# Patient Record
Sex: Male | Born: 1995 | Race: Black or African American | Hispanic: No | Marital: Single | Smoking: Current some day smoker
Health system: Southern US, Community
[De-identification: ages and names within clinical notes are randomized; demographics above are authoritative.]

## PROBLEM LIST (undated history)

## (undated) DIAGNOSIS — F419 Anxiety disorder, unspecified: Secondary | ICD-10-CM

## (undated) HISTORY — DX: Anxiety disorder, unspecified: F41.9

---
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
## 2014-10-05 IMAGING — CR DG KNEE COMPLETE 4+V*R*
4 series · 4 of 4 positions shown · non-contrast
Comparison: None.

CLINICAL DATA: Trauma and pain.

RIGHT KNEE - COMPLETE 4+ VIEW

[view not recorded (1 of 4)]
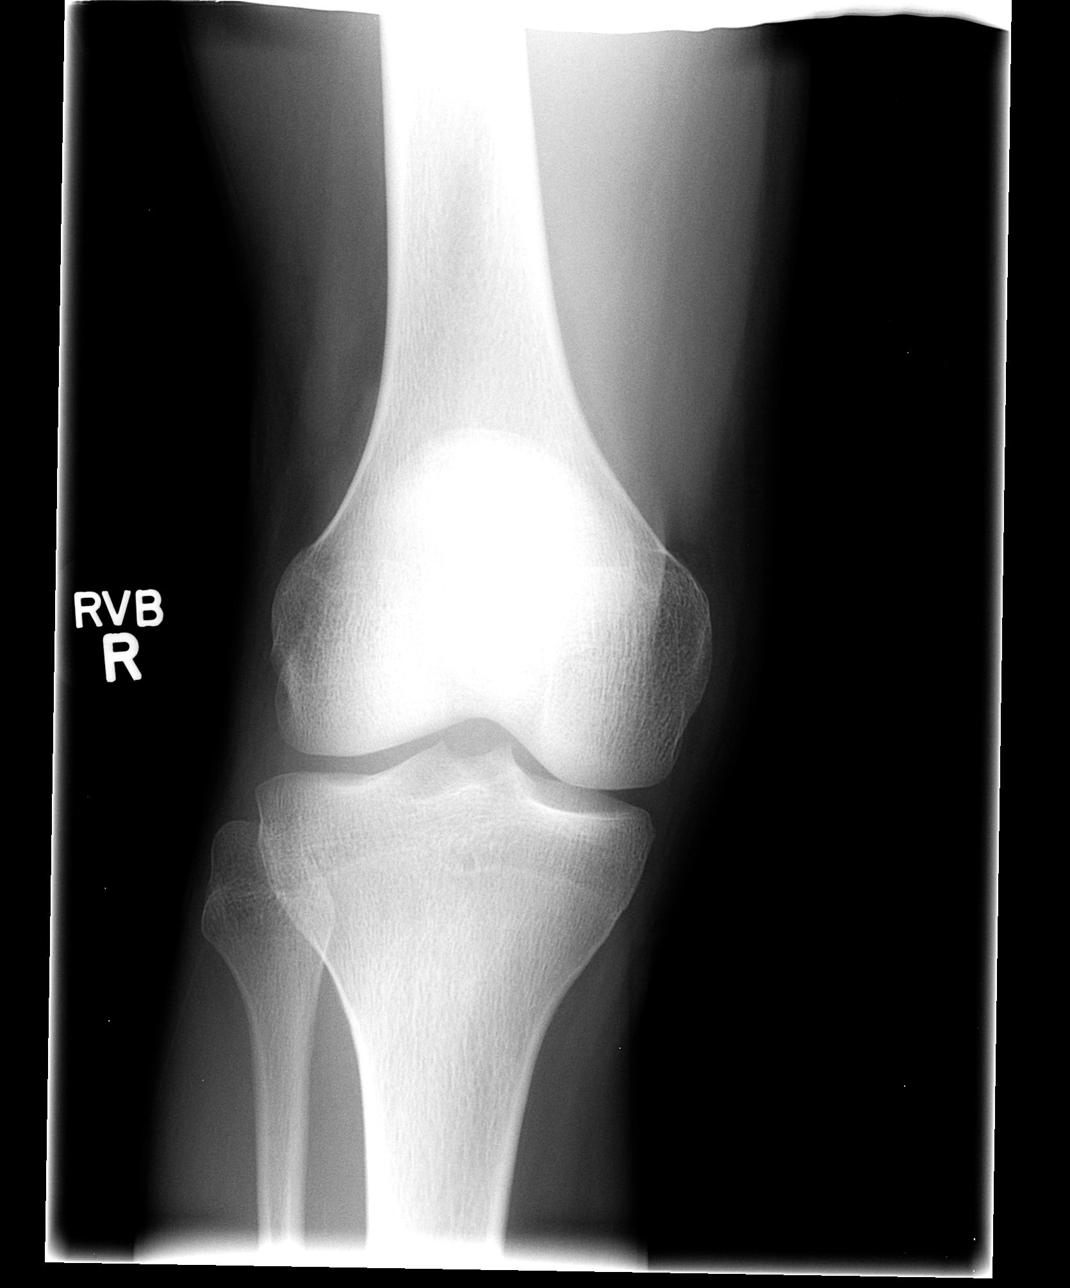

[view not recorded (2 of 4)]
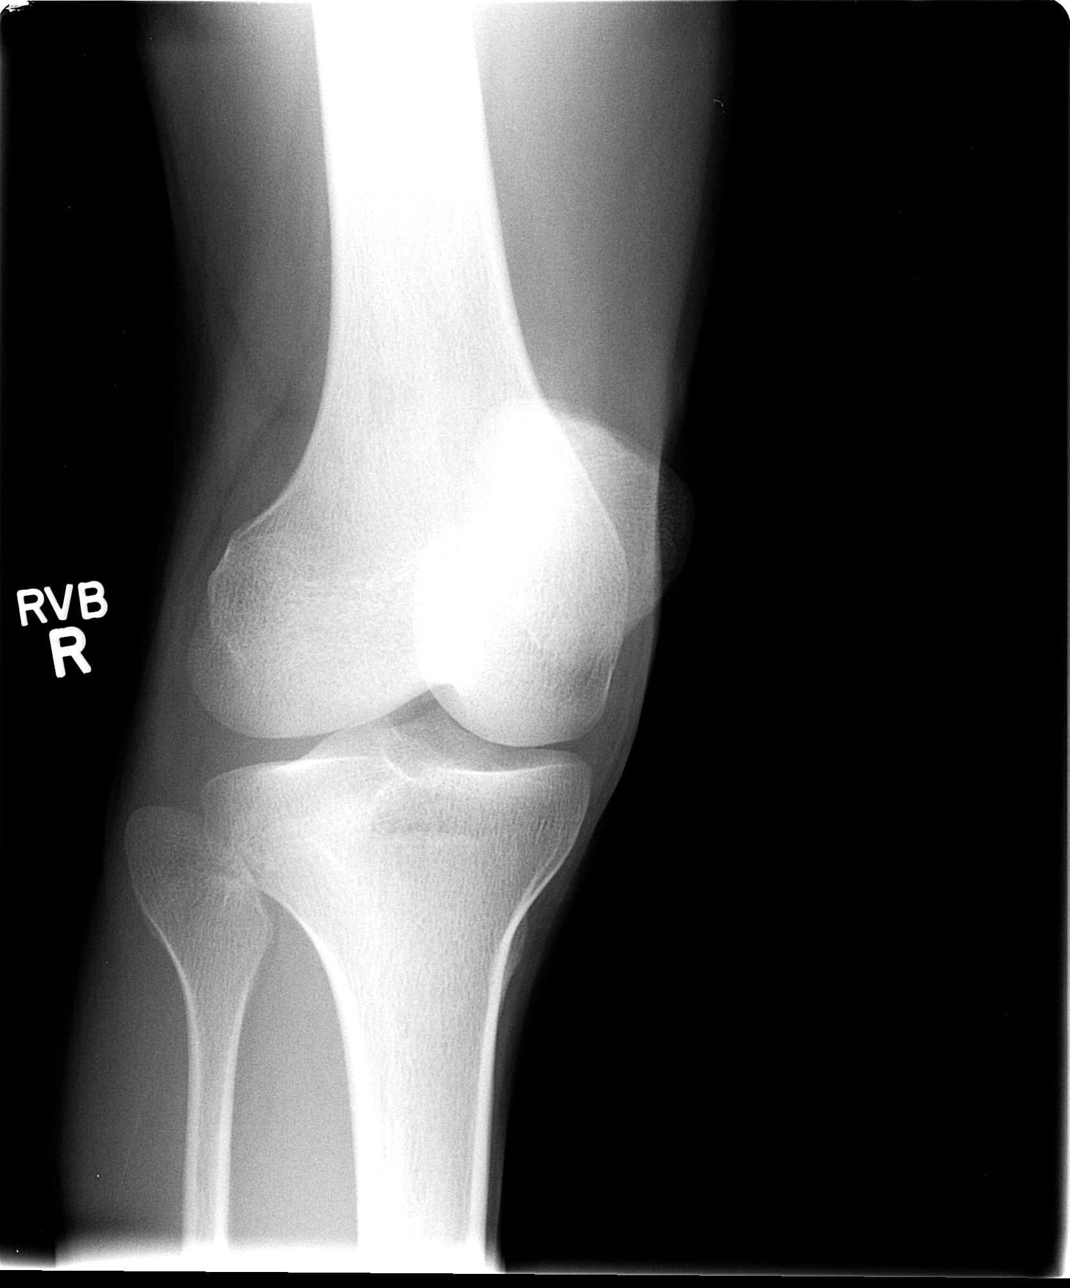

[view not recorded (3 of 4)]
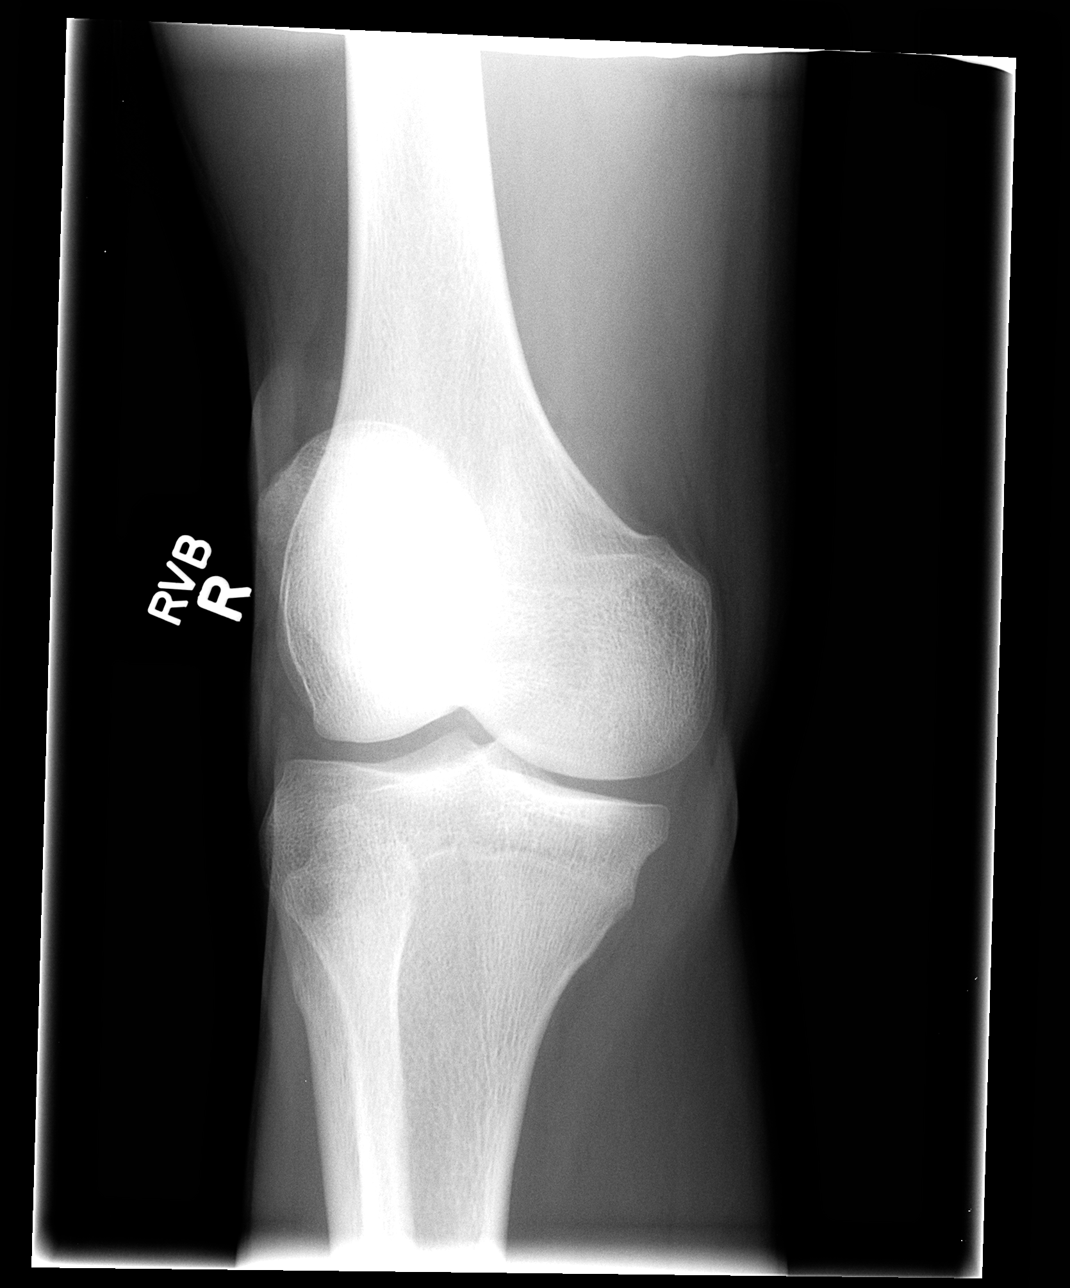

[view not recorded (4 of 4)]
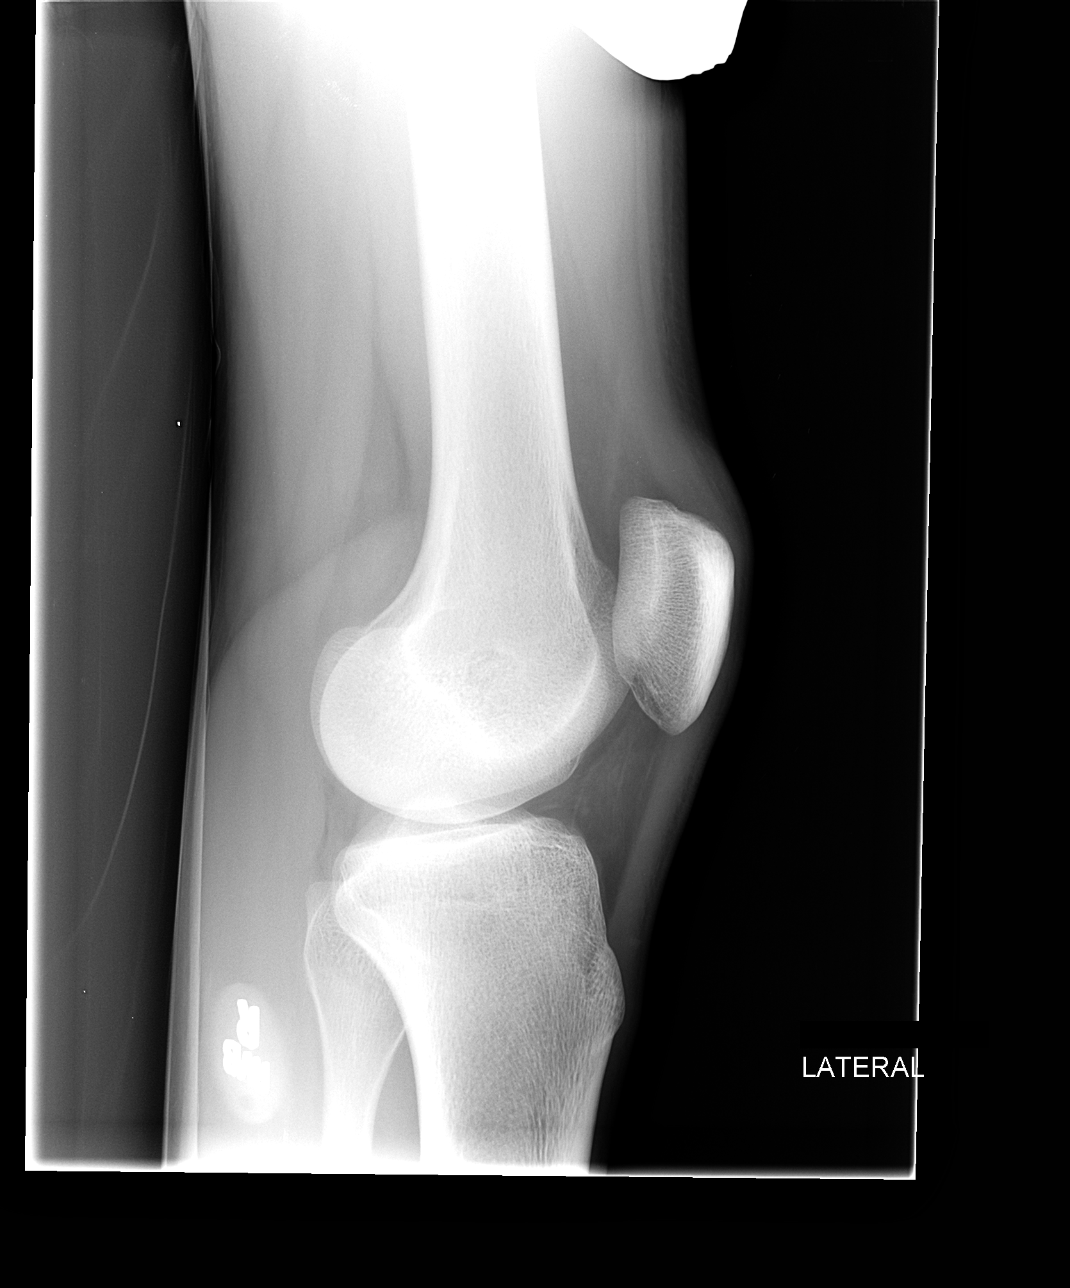

[4 of 4 positions shown; findings below may reference images not displayed]

FINDINGS: No acute fracture or dislocation.  No joint effusion.
IMPRESSION: No acute osseous abnormality.

## 2018-03-21 NOTE — Progress Notes (Signed)
Subjective: WU:[id number] care, knee pain HPI: Brian Mccarty is a 22 y.o. male presenting to clinic today for:  1. Knee pain Patient reports a one-week history of worsening right lateral knee pain.  He denies preceding injury.  He denies numbness, tingling, swelling, discoloration.  He notes that pain was so severe at one point that he was noticeably limping.  He feels like there is weakness, particularly with walking downhill such that he feels that he has to walk diagonally to get down a hill.  Pain is particularly worse with ambulation but only during certain parts of the stride.  Seems to be relieved by sitting.  He has been using a knee brace which has not helped much.  No oral analgesics for pain.  No history of sports or activity related injuries in the past.  No surgeries.  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  Past Medical History:  Diagnosis Date  . Anxiety    History reviewed. No pertinent surgical history. Social History   Socioeconomic History  . Marital status: Single    Spouse name: Not on file  . Number of children: Not on file  . Years of education: Not on file  . Highest education level: Not on file  Occupational History  . Not on file  Social Needs  . Financial resource strain: Not on file  . Food insecurity:    Worry: Not on file    Inability: Not on file  . Transportation needs:    Medical: Not on file    Non-medical: Not on file  Tobacco Use  . Smoking status: Former Smoker    Packs/day: 1.00    Years: 2.00    Pack years: 2.00    Types: Cigarettes    Last attempt to quit: 11/2017    Years since quitting: 0.3  . Smokeless tobacco: Never Used  Substance and Sexual Activity  . Alcohol use: Yes    Comment: OCC  . Drug use: Not Currently    Comment: history of occ MJ  . Sexual activity: Not on file  Lifestyle  . Physical activity:    Days per week: Not on file    Minutes per session: Not on file  .  Stress: Not on file  Relationships  . Social connections:    Talks on phone: Not on file    Gets together: Not on file    Attends religious service: Not on file    Active member of club or organization: Not on file    Attends meetings of clubs or organizations: Not on file    Relationship status: Not on file  . Intimate partner violence:    Fear of current or ex partner: Not on file    Emotionally abused: Not on file    Physically abused: Not on file    Forced sexual activity: Not on file  Other Topics Concern  . Not on file  Social History Narrative  . Not on file   Current Meds  Medication Sig  . [DISCONTINUED] ibuprofen (ADVIL,MOTRIN) 400 MG tablet Take 1 tablet (400 mg total) by mouth every 6 (six) hours as needed for pain.   Family History  Problem Relation Age of Onset  . Brain cancer Mother        mutiple tumors  . Hypertension Mother   . Asthma Sister   . Kidney disease Sister   . Heart disease Maternal Grandfather    No  Known Allergies   ROS: Per HPI  Objective: Office vital signs reviewed. BP 110/67   Pulse 81   Temp 98.5 F (36.9 C) (Oral)   Ht 6\' 1"  (1.854 m)   Wt 147 lb (66.7 kg)   BMI 19.39 kg/m   Physical Examination:  General: Awake, alert, well nourished, well appearing male, No acute distress HEENT: Normal    Eyes: PERRLA, extraocular movement in tact, sclera white    Nose: nasal turbinates moist, no nasal discharge    Throat: moist mucus membranes Cardio: regular rate, +2 DP Pulm: normal work of breathing on room air Extremities: warm, well perfused, No edema, cyanosis or clubbing; +2 pulses bilaterally MSK: normal gait and normal station  Right knee: Patient with well-healed spiral scar from the right lateral knee to the right anterior shin.  No appreciable effusions, joint swelling, erythema.  He has full active range of motion of the knee.  No tenderness to palpation to the patella, patellar tendon, quad tendon, joint line.  He does have  lateral knee pain with tenderness to palpation at the insertion of the IT band.  There is appreciable clicking in this area with range of motion testing.  No crepitus noted.  No posterior popliteal masses or tenderness.  No ligamentous laxity.  He has a positive Thessaly.  Right hip: Full active range of motion.  No tenderness to palpation to the greater trochanter.  Prominent IT band noted.  Negative FADIR.  Pain along the lateral knee with FABER. Skin: dry; intact; no rashes or lesions Neuro: 4/5 Strength in abduction of the hip.  Otherwise lower extremity strength 5/5 and light touch sensation grossly intact  Assessment/ Plan: 22 y.o. male   1. It band syndrome, right Clinically suggestive IT band syndrome.  Differential diagnosis considered include popliteus tendinopathy.  Will proceed with conservative treatment.  Oral NSAID prescribed.  Start with half a tablet daily.  If no improvement within the next 48 hours, may increase to 1 full tablet daily.  Avoid other NSAIDs.  Take with food and plenty of water.  Ice affected area.  Home exercise program and stretches were reviewed with the patient.  Handout was provided.  Information form is scanned [place] also provided should he need this.  Return precautions reviewed.  Patient was good understanding will follow-up as needed.  2. Establishing care with new doctor, encounter for  3. Former tobacco use   Raliegh IpAshly M Gottschalk, DO [facility] [phone]

## 2018-03-22 ENCOUNTER — Ambulatory Visit (INDEPENDENT_AMBULATORY_CARE_PROVIDER_SITE_OTHER): Payer: Self-pay | Admitting: Family Medicine

## 2018-03-22 ENCOUNTER — Encounter: Payer: Self-pay | Admitting: Family Medicine

## 2018-03-22 VITALS — BP 110/67 | HR 81 | Temp 98.5°F | Ht 73.0 in | Wt 147.0 lb

## 2018-03-22 DIAGNOSIS — Z87891 Personal history of nicotine dependence: Secondary | ICD-10-CM

## 2018-03-22 DIAGNOSIS — Z7689 Persons encountering health services in other specified circumstances: Secondary | ICD-10-CM

## 2018-03-22 DIAGNOSIS — M7631 Iliotibial band syndrome, right leg: Secondary | ICD-10-CM

## 2018-03-22 MED ORDER — MELOXICAM 15 MG PO TABS: 7.5000 mg | ORAL_TABLET | Freq: Every day | ORAL | 0 refills | Status: DC

## 2018-03-22 NOTE — Patient Instructions (Addendum)
I have prescribed you medication called meloxicam.  Start by taking 1/2 tablet daily.  If you do not notice substantial improvement in your symptoms, you can increase to 1 full tablet daily.  This medication is not sedating but I do not want you taking it with any other NSAID type medications.  See below.  You have prescribed a nonsteroidal anti-inflammatory drug (NSAID) today. This will help with ear pain and inflammation. Please do not take any other NSAIDs (ibuprofen/Motrin/Advil, naproxen/Aleve, meloxicam/Mobic, Voltaren/diclofenac). Please make sure to eat a meal when taking this medication.   Caution:  If you have a history of acid reflux/indigestion, I recommend that you take an antacid (such as Prilosec, Prevacid) daily while on the NSAID.  If you have a history of bleeding disorder, gastric ulcer, are on a blood thinner (like warfarin/Coumadin, Xarelto, Eliquis, etc) please do not take NSAID.  If you have ever had a heart attack, you should not take NSAIDs.  Iliotibial Band Syndrome Iliotibial band syndrome (ITBS) is a condition that often causes knee pain. It can also cause pain in the outside of your hip, thigh, and knee. The iliotibial band is a strip of tissue that runs from the outside of your hip and down your thigh to the outside of your knee. Repeatedly bending and straightening your knee can irritate the iliotibial band. What are the causes? This condition is caused by inflammation and irritation from the friction of the iliotibial band moving over the thigh bone (femur) when you repeatedly bend and straighten your knee. What increases the risk? This condition is more likely to develop in people who:  Frequently change elevation during their workouts.  Run very long distances.  Recently increased the length or intensity of their workouts.  Run downhill often, or just started running downhill.  Ride a bike very far or often.  You may also be at greater risk if you start  a new workout routine without first warming up or if you have a job that requires you to bend, squat, or climb frequently. What are the signs or symptoms? Symptoms of this condition include:  Pain along the outside of your knee that may be worse with activity, especially running or going up and down stairs.  A "snapping" sensation over your knee.  Swelling on the outside of your knee.  Pain or a feeling of tightness in your hip.  How is this diagnosed? This condition is diagnosed based on your symptoms, medical history, and physical exam. You may also see a health care provider who specializes in reducing pain and increasing mobility (physical therapist). A physical therapist may do an exam to check your balance, movement, and way of walking or running (gait) to see whether the way you move could contribute to your injury. You may also have tests to measure your strength, flexibility, and range of motion. How is this treated? Treatment for this condition includes:  Resting and limiting exercise.  Returning to activities gradually.  Doing range-of-motion and strengthening exercises (physical therapy) as told by your health care provider.  Including low-impact activities, such as swimming, in your exercise routine.  Follow these instructions at home:  If directed, apply ice to the injured area. ? Put ice in a plastic bag. ? Place a towel between your skin and the bag. ? Leave the ice on for 20 minutes, 2-3 times per day.  Return to your normal activities as told by your health care provider. Ask your health care provider what activities  are safe for you.  Keep all follow-up visits with your health care provider. This is important. Contact a health care provider if:  Your pain does not improve or gets worse despite treatment. This information is not intended to replace advice given to you by your health care provider. Make sure you discuss any questions you have with your health  care provider. Document Released: 05/26/2002 Document Revised: 01/05/2017 Document Reviewed: 01/05/2017 Elsevier Interactive Patient Education  [employer]2018 [employer]

## 2018-04-16 ENCOUNTER — Encounter: Payer: Self-pay | Admitting: Family Medicine

## 2018-04-24 ENCOUNTER — Ambulatory Visit: Payer: Self-pay | Admitting: Family Medicine

## 2018-05-01 ENCOUNTER — Ambulatory Visit (INDEPENDENT_AMBULATORY_CARE_PROVIDER_SITE_OTHER): Payer: Self-pay | Admitting: Family Medicine

## 2018-05-01 ENCOUNTER — Encounter: Payer: Self-pay | Admitting: Family Medicine

## 2018-05-01 DIAGNOSIS — S83281A Other tear of lateral meniscus, current injury, right knee, initial encounter: Secondary | ICD-10-CM

## 2018-05-01 NOTE — Progress Notes (Signed)
   HPI  CC: Bilateral knee pain Patient is here with complaints of bilateral knee pain over the past 1 month.  He states that his right is significantly worse than his left.  He denies any trauma, injury, or event which may have caused this pain.  He states that the pain is mostly along the lateral aspect of his right knee.  He endorses nearly daily but definitely weekly mechanical locking of the affected knee that inhibits him from fully extending the knee and can last up to 2 hours.  He endorses occasional swelling of his knees on long work days.  He endorses regular popping and clicking of his knees with deep squats.  He denies any feelings of instability.  No weakness, numbness or paresthesias.  Traumatic: No  Location: Lateral knees (R>L) Quality: Occasionally sharp aching and stiff  Duration: 1 month Timing: Squatting and exercise Improving/Worsening: Unchanged Makes better: Rest Makes worse: Prolonged activity Associated symptoms: Mechanical locking  Previous Interventions Tried: Relative rest and compression sleeve  Past Injuries: No known injury to either knee Past Surgeries: Noncontributory Smoking: Current smoker Family Hx: Noncontributory  ROS: Per HPI; in addition no fever, no rash, no additional weakness, no additional numbness, no additional paresthesias, and no additional falls/injury.   Objective: BP 116/72   Ht 6' (1.829 m)   Wt 148 lb (67.1 kg)   BMI 20.07 kg/m  Gen: NAD, well groomed, a/o x3, normal affect.  CV: Well-perfused. Warm.  Resp: Non-labored.  Neuro: Sensation intact throughout. No gross coordination deficits.  Gait: Nonpathologic posture, unremarkable stride without signs of limp or balance issues. Knee, Bilateral: TTP noted at the lateral aspect of both knees (R>L). Inspection was negative for erythema, ecchymosis, and effusion. No obvious bony abnormalities or signs of osteophyte development. Palpation yielded no asymmetric warmth; bilateral  lateral joint line tenderness; No condyle tenderness; No patellar tenderness; No patellar crepitus. Patellar and quadriceps tendons unremarkable, and no tenderness of the pes anserine bursa. No obvious Baker's cyst development. ROM normal in flexion (135 degrees) and extension (0 degrees). Normal hamstring and quadriceps strength. Neurovascularly intact bilaterally.  - Ligaments: (Solid and consistent endpoints)   - ACL (present bilaterally)   - PCL (present bilaterally)   - LCL (present bilaterally)   - MCL (present bilaterally).   - Meniscus:   - Thessaly: Positive for discomfort on the right side  - Patella:   - Patellar grind/compression: NEG   - Patellar glide: Without apprehension   Assessment and Plan:  Acute lateral meniscus tear of right knee Patient is here with signs and symptoms consistent with a right sided lateral meniscus tear.  Mechanical symptoms seem to be relatively common.  I discussed with patient the possible treatment options at this time and patient expresses desire to look into possible surgical intervention. -Patient is to contact our office once he is decided on a site to obtain an MRI >> at that time an MRI for his right knee will be ordered. -Once MRI is obtained I will contact patient with results and we will move forward from that point -If MRI shows a meniscus tear or other ailments requiring surgical intervention and I will refer him at that time.  Otherwise patient will follow-up in our office.   Kathee Delton, MD,MS [facility] Sports Medicine Fellow 05/01/2018 6:06 PM

## 2018-05-01 NOTE — Assessment & Plan Note (Signed)
Patient is here with signs and symptoms consistent with a right sided lateral meniscus tear.  Mechanical symptoms seem to be relatively common.  I discussed with patient the possible treatment options at this time and patient expresses desire to look into possible surgical intervention. -Patient is to contact our office once he is decided on a site to obtain an MRI >> at that time an MRI for his right knee will be ordered. -Once MRI is obtained I will contact patient with results and we will move forward from that point -If MRI shows a meniscus tear or other ailments requiring surgical intervention and I will refer him at that time.  Otherwise patient will follow-up in our office.

## 2018-05-01 NOTE — Patient Instructions (Signed)
-  Call the MRI locations on the list we provided you today.  -Once you have found a location you'd like to get the MRI at please call our office [phone]) and let us know so we can put the order in. -Once you have gotten the MRI we will call you with the results.

## 2018-05-02 NOTE — Addendum Note (Signed)
Addended by: Annita Brod on: 05/02/2018 08:30 AM   Modules accepted: Orders

## 2019-01-18 ENCOUNTER — Encounter (HOSPITAL_COMMUNITY): Payer: Self-pay

## 2019-01-18 ENCOUNTER — Ambulatory Visit (HOSPITAL_COMMUNITY)
Admission: EM | Admit: 2019-01-18 | Discharge: 2019-01-18 | Disposition: A | Discharge status: Home / Self Care | Payer: Managed Care, Other (non HMO) | Attending: Family Medicine | Admitting: Family Medicine

## 2019-01-18 ENCOUNTER — Other Ambulatory Visit: Payer: Self-pay

## 2019-01-18 DIAGNOSIS — R197 Diarrhea, unspecified: Secondary | ICD-10-CM | POA: Diagnosis not present

## 2019-01-18 NOTE — ED Triage Notes (Signed)
Pt presents today with abdominal pain and diarrhea. States that he woke up this morning and had stomach discomfort and 2 episodes of diarrhea. Has had some nausea but no vomiting. States he thinks he could be lactose intolerant because he did eat a couple of cups of yogurt right before bed.

## 2019-01-20 NOTE — ED Provider Notes (Signed)
[facility]   [id number] 01/18/19 Arrival Time: 1705  ASSESSMENT & PLAN:  1. Diarrhea, unspecified type   Day #1 of illness. Symptoms not worsening. No suspicion at this point for bacterial etiology. Discussed.  Discussed typical duration of symptoms for likely viral GI illness. Will do his best to ensure adequate fluid intake in order to avoid dehydration. Will proceed to the Emergency Department for evaluation if unable to tolerate PO fluids regularly.  Otherwise he will f/u with his PCP or here if not showing improvement over the next 48-72 hours.  Reviewed expectations re: course of current medical issues. Questions answered. Outlined signs and symptoms indicating need for more acute intervention. Patient verbalized understanding. After Visit Summary given.   SUBJECTIVE: History from: patient.  Brian Mccarty is a 23 y.o. male who presents with complaint of mild nausea with non-bloody diarrhea x 2. Onset upon waking this morning. Abdominal discomfort: mild and cramping. Symptoms are stable since beginning. Aggravating factors: none identified. Alleviating factors: none identified. Associated symptoms: some fatigue. He denies chills, constipation, dysuria, fever, headache and sweats. Appetite: decreased. PO intake: decreased. Ambulatory without assistance. Urinary symptoms: none. Sick contacts: none. Recent travel or camping: none. OTC treatment: none.  History reviewed. No pertinent surgical history.  ROS: As per HPI.  OBJECTIVE:  Vitals:   01/18/19 1729  BP: 116/71  Pulse: 65  Resp: 16  Temp: 98.6 F (37 C)  TempSrc: Oral  SpO2: 98%    General appearance: alert; no distress Oropharynx: moist Lungs: clear to auscultation bilaterally; unlabored Heart: regular rate and rhythm Abdomen: soft; non-distended; no significant abdominal tenderness; bowel sounds present; no masses or organomegaly; no guarding or rebound tenderness Back: no CVA  tenderness Extremities: no edema; symmetrical with no gross deformities Skin: warm; dry Neurologic: normal gait Psychological: alert and cooperative; normal mood and affect   No Known Allergies                                             Past Medical History:  Diagnosis Date  . Anxiety    Social History   Socioeconomic History  . Marital status: Single    Spouse name: Not on file  . Number of children: Not on file  . Years of education: Not on file  . Highest education level: Not on file  Occupational History  . Not on file  Social Needs  . Financial resource strain: Not on file  . Food insecurity:    Worry: Not on file    Inability: Not on file  . Transportation needs:    Medical: Not on file    Non-medical: Not on file  Tobacco Use  . Smoking status: Former Smoker    Packs/day: 1.00    Years: 2.00    Pack years: 2.00    Types: Cigarettes    Last attempt to quit: 11/2017    Years since quitting: 1.1  . Smokeless tobacco: Never Used  Substance and Sexual Activity  . Alcohol use: Yes    Comment: OCC  . Drug use: Not Currently    Comment: history of occ MJ  . Sexual activity: Not on file  Lifestyle  . Physical activity:    Days per week: Not on file    Minutes per session: Not on file  . Stress: Not on file  Relationships  . Social connections:  Talks on phone: Not on file    Gets together: Not on file    Attends religious service: Not on file    Active member of club or organization: Not on file    Attends meetings of clubs or organizations: Not on file    Relationship status: Not on file  . Intimate partner violence:    Fear of current or ex partner: Not on file    Emotionally abused: Not on file    Physically abused: Not on file    Forced sexual activity: Not on file  Other Topics Concern  . Not on file  Social History Narrative  . Not on file   Family History  Problem Relation Age of Onset  . Brain cancer Mother        mutiple tumors  .  Hypertension Mother   . Asthma Sister   . Kidney disease Sister   . Heart disease Maternal Glynda Jaeger, MD 01/22/19 1229

## 2019-10-08 ENCOUNTER — Encounter: Payer: Self-pay | Admitting: Family

## 2019-10-08 ENCOUNTER — Ambulatory Visit (INDEPENDENT_AMBULATORY_CARE_PROVIDER_SITE_OTHER): Payer: Self-pay | Admitting: Family

## 2019-10-08 DIAGNOSIS — L259 Unspecified contact dermatitis, unspecified cause: Secondary | ICD-10-CM

## 2019-10-08 MED ORDER — PREDNISONE 10 MG (21) PO TBPK: ORAL_TABLET | ORAL | 0 refills | Status: DC

## 2019-10-08 NOTE — Progress Notes (Signed)
   Virtual Visit via telephone Note Due to COVID-19 pandemic this visit was conducted virtually. This visit type was conducted due to national recommendations for restrictions regarding the COVID-19 Pandemic (e.g. social distancing, sheltering in place) in an effort to limit this patient's exposure and mitigate transmission in our community. All issues noted in this document were discussed and addressed.  A physical exam was not performed with this format.  I connected with Brian Mccarty on 10/08/19 at 8:32 AM  by telephone and verified that I am speaking with the correct person using two identifiers. Brian Mccarty is currently located at home and no one is currently with him during visit. The provider, Evelina Dun, FNP is located in their office at time of visit.  I discussed the limitations, risks, security and privacy concerns of performing an evaluation and management service by telephone and the availability of in person appointments. I also discussed with the patient that there may be a patient responsible charge related to this service. The patient expressed understanding and agreed to proceed.   History and Present Illness:  PT calls the office today with a papular rash that started on 09/26/19.  He states he has washed all his bedding, clothes and taken benadryl with no relief. He was also tested for COVID to rule that out and it was negative.  Rash This is a new problem. The current episode started 1 to 4 weeks ago. The problem has been gradually worsening since onset. The affected locations include the chest, abdomen, back and left arm. The rash is characterized by redness. He was exposed to nothing. Pertinent negatives include no congestion, cough, diarrhea, eye pain, joint pain, nail changes or sore throat. Past treatments include antihistamine. The treatment provided mild relief.      Review of Systems  HENT: Negative for congestion and sore throat.   Eyes: Negative for pain.   Respiratory: Negative for cough.   Gastrointestinal: Negative for diarrhea.  Musculoskeletal: Negative for joint pain.  Skin: Positive for rash. Negative for nail changes.  All other systems reviewed and are negative.    Observations/Objective: No SOB or distress noted   Assessment and Plan: 1. Contact dermatitis, unspecified contact dermatitis type, unspecified trigger Do not scratch Avoid irritants  Wash jackets and shirts Call if symptoms worsen or do not improve  - predniSONE (STERAPRED UNI-PAK 21 TAB) 10 MG (21) TBPK tablet; Use as directed  Dispense: 21 tablet; Refill: 0     I discussed the assessment and treatment plan with the patient. The patient was provided an opportunity to ask questions and all were answered. The patient agreed with the plan and demonstrated an understanding of the instructions.   The patient was advised to call back or seek an in-person evaluation if the symptoms worsen or if the condition fails to improve as anticipated.  The above assessment and management plan was discussed with the patient. The patient verbalized understanding of and has agreed to the management plan. Patient is aware to call the clinic if symptoms persist or worsen. Patient is aware when to return to the clinic for a follow-up visit. Patient educated on when it is appropriate to go to the emergency department.   Time call ended:  8:50 AM  I provided 18 minutes of non-face-to-face time during this encounter.    Evelina Dun, FNP

## 2019-12-08 ENCOUNTER — Ambulatory Visit: Payer: Managed Care, Other (non HMO) | Attending: Internal Medicine

## 2019-12-08 ENCOUNTER — Other Ambulatory Visit: Payer: Self-pay

## 2019-12-08 DIAGNOSIS — Z20822 Contact with and (suspected) exposure to covid-19: Secondary | ICD-10-CM

## 2019-12-09 LAB — NOVEL CORONAVIRUS, NAA: SARS-CoV-2, NAA: NOT DETECTED

## 2019-12-10 ENCOUNTER — Encounter: Payer: Self-pay | Admitting: Family Medicine

## 2020-04-20 ENCOUNTER — Ambulatory Visit (INDEPENDENT_AMBULATORY_CARE_PROVIDER_SITE_OTHER): Payer: Managed Care, Other (non HMO) | Admitting: Sports Medicine

## 2020-04-20 ENCOUNTER — Other Ambulatory Visit: Payer: Self-pay

## 2020-04-20 VITALS — BP 112/72 | Ht 73.0 in | Wt 150.0 lb

## 2020-04-20 DIAGNOSIS — M7631 Iliotibial band syndrome, right leg: Secondary | ICD-10-CM | POA: Diagnosis not present

## 2020-04-20 NOTE — Progress Notes (Signed)
  Brian Mccarty is a 24 y.o. male   HPI:  Knee Pain: Patient presents with knee pain involving the  right knee. Onset of the symptoms was 3 years ago. Inciting event: this is a longstanding problem which has been getting worse. Current symptoms include pain located right lateral knee. Pain is aggravated by kneeling, lateral movements and pivoting.  Patient has had no prior knee problems.  Denies locking catching or popping.  No feelings of instability.  No swelling.  Past Medical History:  Diagnosis Date  . Anxiety    No past surgical history on file.  Current Outpatient Medications:  .  ibuprofen (ADVIL) 800 MG tablet, Take by mouth., Disp: , Rfl:  No Known Allergies  reports that he quit smoking about 2 years ago. His smoking use included cigarettes. He has a 2.00 pack-year smoking history. He has never used smokeless tobacco. He reports current alcohol use. He reports previous drug use. Family History  Problem Relation Age of Onset  . Brain cancer Mother        mutiple tumors  . Hypertension Mother   . Asthma Sister   . Kidney disease Sister   . Heart disease Maternal Grandfather     Knee: Normal to inspection with no erythema or effusion or obvious bony abnormalities. Palpation: Tender to palpation along the lateral femoral condyle of the right knee ROM normal in flexion and extension and lower leg rotation. Ligaments with solid consistent endpoints including ACL, PCL, LCL, MCL. Negative Thessaly Non painful patellar compression. Patellar and quadriceps tendons unremarkable. Hamstring and quadriceps strength is normal.  Noticeable hip abductor weakness on the right compared to the left.   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  IT band stretches.  May use over-the-counter anti-inflammatories as needed for pain.  If symptoms persist consider imaging in the form of x-ray and ultrasound initially.   Follow-up for ongoing or recalcitrant issues.

## 2020-04-20 NOTE — Patient Instructions (Signed)
Iliotibial Band Syndrome  Iliotibial band syndrome (ITBS) is a condition that often causes knee pain. It can also cause pain in the outside of your hip, thigh, and knee. The iliotibial band is a strip of tissue that runs from the outside of your hip and down your thigh to the outside of your knee. Repeatedly bending and straightening your knee can irritate the iliotibial band. What are the causes? This condition is caused by inflammation and irritation from the friction of the iliotibial band moving over the thigh bone (femur) when you repeatedly bend and straighten your knee. What increases the risk? This condition is more likely to develop in people who: Frequently change elevation during their workouts. Run very long distances. Recently increased the length or intensity of their workouts. Run downhill often, or just started running downhill. Ride a bike very far or often. You may also be at greater risk if you start a new workout routine without first warming up or if you have a job that requires you to bend, squat, or climb frequently. What are the signs or symptoms? Symptoms of this condition include: Pain along the outside of your knee that may be worse with activity, especially running or going up and down stairs. A "snapping" sensation over your knee. Swelling on the outside of your knee. Pain or a feeling of tightness in your hip. How is this diagnosed? This condition is diagnosed based on your symptoms, medical history, and physical exam. You may also see a health care provider who specializes in reducing pain and increasing mobility (physical therapist). A physical therapist may do an exam to check your balance, movement, and way of walking or running (gait) to see whether the way you move could contribute to your injury. You may also have tests to measure your strength, flexibility, and range of motion. How is this treated? Treatment for this condition includes: Resting and  limiting exercise. Returning to activities gradually. Doing range-of-motion and strengthening exercises (physical therapy) as told by your health care provider. Including low-impact activities, such as swimming, in your exercise routine. Follow these instructions at home: If directed, apply ice to the injured area. Put ice in a plastic bag. Place a towel between your skin and the bag. Leave the ice on for 20 minutes, 2-3 times per day. Return to your normal activities as told by your health care provider. Ask your health care provider what activities are safe for you. Keep all follow-up visits with your health care provider. This is important. Contact a health care provider if: Your pain does not improve or gets worse despite treatment. This information is not intended to replace advice given to you by your health care provider. Make sure you discuss any questions you have with your health care provider. Document Revised: 11/16/2017 Document Reviewed: 01/05/2017 Elsevier Patient Education  [facility] Band Syndrome [facility] Ask your health care provider which exercises are safe for you. Do exercises exactly as told by your health care provider and adjust them as directed. It is normal to feel mild stretching, pulling, tightness, or discomfort as you do these exercises. Stop right away if you feel sudden pain or your pain gets significantly worse. Do not begin these exercises until told by your health care provider. Stretching and range-of-motion exercises These exercises warm up your muscles and joints and improve the movement and flexibility of your hip and pelvis. Quadriceps stretch, prone  1. Lie on your abdomen on a firm surface, such as a  bed or padded floor (prone position). 2. Bend your left / right knee and reach back to hold your ankle or pant leg. If you cannot reach your ankle or pant leg, loop a belt around your foot and grab the belt instead. 3. Gently pull your  heel toward your buttocks. Your knee should not slide out to the side. You should feel a stretch in the front of your thigh and knee (quadriceps). 4. Hold this position for __________ seconds. Repeat __________ times. Complete this exercise __________ times a day. Iliotibial band stretch An iliotibial band is a strong band of muscle tissue that runs from the outer side of your hip to the outer side of your thigh and knee. 1. Lie on your side with your left / right leg in the top position. 2. Bend both of your knees and grab your left / right ankle. Stretch out your bottom arm to help you balance. 3. Slowly bring your top knee back so your thigh goes behind your trunk. 4. Slowly lower your top leg toward the floor until you feel a gentle stretch on the outside of your left / right hip and thigh. If you do not feel a stretch and your knee will not fall farther, place the heel of your other foot on top of your knee and pull your knee down toward the floor with your foot. 5. Hold this position for __________ seconds. Repeat __________ times. Complete this exercise __________ times a day. Strengthening exercises These exercises build strength and endurance in your hip and pelvis. Endurance is the ability to use your muscles for a long time, even after they get tired. Straight leg raises, side-lying This exercise strengthens the muscles that rotate the leg at the hip and move it away from your body (hip abductors). 1. Lie on your side with your left / right leg in the top position. Lie so your head, shoulder, hip, and knee line up. You may bend your bottom knee to help you balance. 2. Roll your hips slightly forward so your hips are stacked directly over each other and your left / right knee is facing forward. 3. Tense the muscles in your outer thigh and lift your top leg 4-6 inches (10-15 cm). 4. Hold this position for __________ seconds. 5. Slowly return to the starting position. Let your muscles  relax completely before doing another repetition. Repeat __________ times. Complete this exercise __________ times a day. Leg raises, prone This exercise strengthens the muscles that move the hips (hip extensors). 1. Lie on your abdomen on your bed or a firm surface. You can put a pillow under your hips if that is more comfortable for your lower back. 2. Bend your left / right knee so your foot is straight up in the air. 3. Squeeze your buttocks muscles and lift your left / right thigh off the bed. Do not let your back arch. 4. Tense your thigh muscle as hard as you can without increasing any knee pain. 5. Hold this position for __________ seconds. 6. Slowly lower your leg to the starting position and allow it to relax completely. Repeat __________ times. Complete this exercise __________ times a day. Hip hike 1. Stand sideways on a bottom step. Stand on your left / right leg with your other foot unsupported next to the step. You can hold on to the railing or wall for balance if needed. 2. Keep your knees straight and your torso square. Then lift your left / right hip  up toward the ceiling. 3. Slowly let your left / right hip lower toward the floor, past the starting position. Your foot should get closer to the floor. Do not lean or bend your knees. Repeat __________ times. Complete this exercise __________ times a day. This information is not intended to replace advice given to you by your health care provider. Make sure you discuss any questions you have with your health care provider. Document Revised: 03/27/2019 Document Reviewed: 09/25/2018 Elsevier Patient Education  2020 [employer].

## 2020-04-21 ENCOUNTER — Encounter: Payer: Self-pay | Admitting: Sports Medicine

## 2020-05-17 ENCOUNTER — Other Ambulatory Visit: Payer: Self-pay

## 2020-05-17 ENCOUNTER — Ambulatory Visit (HOSPITAL_COMMUNITY)
Admission: EM | Admit: 2020-05-17 | Discharge: 2020-05-17 | Disposition: A | Discharge status: Home / Self Care | Payer: Managed Care, Other (non HMO) | Attending: Family Medicine | Admitting: Family Medicine

## 2020-05-17 ENCOUNTER — Encounter (HOSPITAL_COMMUNITY): Payer: Self-pay

## 2020-05-17 DIAGNOSIS — S90111A Contusion of right great toe without damage to nail, initial encounter: Secondary | ICD-10-CM | POA: Diagnosis not present

## 2020-05-17 MED ORDER — IBUPROFEN 800 MG PO TABS: 800.0000 mg | ORAL_TABLET | Freq: Four times a day (QID) | ORAL | 0 refills | Status: AC | PRN

## 2020-05-17 NOTE — ED Triage Notes (Signed)
Pt states he stubbed his 1st right digit yesterday and he dropped a 5 lb plate on toe. Pt has trouble bending toe down. Pt states he gets pin in toe when he walk on it or moves the toe, but not at rest. Pt walked well to triage room.

## 2020-05-17 NOTE — Discharge Instructions (Signed)
May take ibuprofen for pain Limit walking while foot is painful Return as needed

## 2020-05-17 NOTE — ED Provider Notes (Signed)
[place]    CSN: [id number] Arrival date & time: 05/17/20  1044      History   Chief Complaint Chief Complaint  Patient presents with  . Toe Injury    HPI Brian Mccarty is a 24 y.o. male.   HPI  Patient is here for a toe injury.  He states first he stubbed his toe yesterday.  Then he, unfortunately, dropped a plate on his toe.  He is having pain in the first MTP.  Pain with weightbearing  Past Medical History:  Diagnosis Date  . Anxiety     Patient Active Problem List   Diagnosis Date Noted  . Acute lateral meniscus tear of right knee 05/01/2018  . It band syndrome, right 03/22/2018  . Former tobacco use 03/22/2018    History reviewed. No pertinent surgical history.     Home Medications    Prior to Admission medications   Medication Sig Start Date End Date Taking? Authorizing Provider  ibuprofen (ADVIL) 800 MG tablet Take 1 tablet (800 mg total) by mouth every 6 (six) hours as needed for moderate pain. 05/17/20 05/17/21  Raylene Everts, MD    Family History Family History  Problem Relation Age of Onset  . Brain cancer Mother        mutiple tumors  . Hypertension Mother   . Asthma Sister   . Kidney disease Sister   . Heart disease Maternal Grandfather     Social History Social History   Tobacco Use  . Smoking status: Current Some Day Smoker    Packs/day: 1.00    Years: 2.00    Pack years: 2.00    Types: Cigarettes    Last attempt to quit: 11/2017    Years since quitting: 2.4  . Smokeless tobacco: Never Used  Substance Use Topics  . Alcohol use: Yes    Comment: OCC  . Drug use: Not Currently    Comment: history of occ MJ     Allergies   Patient has no known allergies.   Review of Systems Review of Systems  Musculoskeletal: Positive for arthralgias and gait problem.    Physical Exam Triage Vital Signs ED Triage Vitals  Enc Vitals Group     BP 05/17/20 1133 126/67     Pulse Rate 05/17/20 1133 65     Resp  05/17/20 1133 16     Temp 05/17/20 1133 98.2 F (36.8 C)     Temp Source 05/17/20 1133 Oral     SpO2 05/17/20 1133 100 %     Weight 05/17/20 1136 150 lb (68 kg)     Height 05/17/20 1136 6\' 1"  (1.854 m)     Head Circumference --      Peak Flow --      Pain Score 05/17/20 1136 0     Pain Loc --      Pain Edu? --      Excl. in [place]? --    No data found.  Updated Vital Signs BP 126/67   Pulse 65   Temp 98.2 F (36.8 C) (Oral)   Resp 16   Ht 6\' 1"  (1.854 m)   Wt 68 kg   SpO2 100%   BMI 19.79 kg/m      Physical Exam Constitutional:      General: He is not in acute distress.    Appearance: He is well-developed and normal weight.  HENT:     Head: Normocephalic and atraumatic.     Mouth/Throat:  Comments: Mask in place Eyes:     Conjunctiva/sclera: Conjunctivae normal.     Pupils: Pupils are equal, round, and reactive to light.  Cardiovascular:     Rate and Rhythm: Normal rate.  Pulmonary:     Effort: Pulmonary effort is normal. No respiratory distress.  Musculoskeletal:        General: Normal range of motion.     Cervical back: Normal range of motion.       Feet:     Comments: , LowTenderness over the first MTP aspect.  Mild swelling.  Mild erythema  Skin:    General: Skin is warm and dry.  Neurological:     Mental Status: He is alert.  Psychiatric:        Mood and Affect: Mood normal.        Behavior: Behavior normal.      UC Treatments / Results  Labs (all labs ordered are listed, but only abnormal results are displayed) Labs Reviewed - No data to display  EKG   Radiology No results found.  Procedures Procedures (including critical care time)  Medications Ordered in UC Medications - No data to display  Initial Impression / Assessment and Plan / UC Course  I have reviewed the triage vital signs and the nursing notes.  Pertinent labs & imaging results that were available during my care of the patient were reviewed by me and considered in my  medical decision making (see chart for details).     Originally an x-ray was ordered.  There is a delay in x-ray today.  The patient states he prefers just to get a work note and come back if his toe does not improve. Final Clinical Impressions(s) / UC Diagnoses   Final diagnoses:  Contusion of right great toe without damage to nail, initial encounter     Discharge Instructions     May take ibuprofen for pain Limit walking while foot is painful Return as needed    ED Prescriptions    Medication Sig Dispense Auth. Provider   ibuprofen (ADVIL) 800 MG tablet Take 1 tablet (800 mg total) by mouth every 6 (six) hours as needed for moderate pain. 30 tablet Eustace Moore, MD     PDMP not reviewed this encounter.   Eustace Moore, MD 05/17/20 1323

## 2020-06-01 ENCOUNTER — Ambulatory Visit: Payer: Managed Care, Other (non HMO) | Admitting: Sports Medicine

## 2020-06-10 ENCOUNTER — Ambulatory Visit: Payer: Managed Care, Other (non HMO) | Admitting: Sports Medicine

## 2023-02-27 ENCOUNTER — Ambulatory Visit (INDEPENDENT_AMBULATORY_CARE_PROVIDER_SITE_OTHER): Payer: Self-pay

## 2023-02-27 ENCOUNTER — Ambulatory Visit (HOSPITAL_COMMUNITY)
Admission: EM | Admit: 2023-02-27 | Discharge: 2023-02-27 | Disposition: A | Discharge status: Home / Self Care | Payer: Self-pay | Attending: Internal Medicine | Admitting: Internal Medicine

## 2023-02-27 ENCOUNTER — Other Ambulatory Visit: Payer: Self-pay

## 2023-02-27 ENCOUNTER — Encounter (HOSPITAL_COMMUNITY): Payer: Self-pay

## 2023-02-27 DIAGNOSIS — M79641 Pain in right hand: Secondary | ICD-10-CM

## 2023-02-27 MED ORDER — CELECOXIB 100 MG PO CAPS: 100.0000 mg | ORAL_CAPSULE | Freq: Two times a day (BID) | ORAL | 0 refills | Status: AC

## 2023-02-27 NOTE — Discharge Instructions (Addendum)
Advised patient of right hand x-ray results with hardcopy provided to patient.  Instructed patient to take medication as directed with food to completion.  Advised patient may RICE affected area of right hand 30 minutes 3 times daily for the next 3 days.  Advised if symptoms worsen and/or unresolved please follow-up with PCP or here for further evaluation.

## 2023-02-27 NOTE — ED Provider Notes (Signed)
[facility]    CSN: [id number] Arrival date & time: 02/27/23  1254      History   Chief Complaint Chief Complaint  Patient presents with   Hand Injury    HPI Brian Mccarty is a 27 y.o. male.   HPI 27 year old male presents with right hand pain secondary to punching a wooden door frame or wall 5 days ago.  Patient reports does not have strength to hold hammer and right hand when working.  PMH significant for anxiety and current everyday cigarette use.  Past Medical History:  Diagnosis Date   Anxiety     Patient Active Problem List   Diagnosis Date Noted   Acute lateral meniscus tear of right knee 05/01/2018   It band syndrome, right 03/22/2018   Former tobacco use 03/22/2018    History reviewed. No pertinent surgical history.     Home Medications    Prior to Admission medications   Medication Sig Start Date End Date Taking? Authorizing Provider  celecoxib (CELEBREX) 100 MG capsule Take 1 capsule (100 mg total) by mouth 2 (two) times daily for 15 days. 02/27/23 03/14/23 Yes Eliezer Lofts, FNP    Family History Family History  Problem Relation Age of Onset   Brain cancer Mother        mutiple tumors   Hypertension Mother    Asthma Sister    Kidney disease Sister    Heart disease Maternal Grandfather     Social History Social History   Tobacco Use   Smoking status: Some Days    Packs/day: 1.00    Years: 2.00    Total pack years: 2.00    Types: Cigarettes    Last attempt to quit: 11/2017    Years since quitting: 5.2   Smokeless tobacco: Never  Vaping Use   Vaping Use: Some days  Substance Use Topics   Alcohol use: Yes    Comment: OCC   Drug use: Not Currently    Comment: history of occ MJ     Allergies   Patient has no known allergies.   Review of Systems Review of Systems  Musculoskeletal:        Right hand pain x 5 days     Physical Exam Triage Vital Signs ED Triage Vitals [02/27/23 1355]  Enc Vitals Group     BP  127/88     Pulse Rate 65     Resp 18     Temp 98.3 F (36.8 C)     Temp Source Oral     SpO2 99 %     Weight      Height      Head Circumference      Peak Flow      Pain Score 4     Pain Loc      Pain Edu?      Excl. in [place]?    No data found.  Updated Vital Signs BP 127/88 (BP Location: Left Arm)   Pulse 65   Temp 98.3 F (36.8 C) (Oral)   Resp 18   SpO2 99%      Physical Exam Vitals and nursing note reviewed.  Constitutional:      General: He is not in acute distress.    Appearance: Normal appearance. He is normal weight. He is not ill-appearing.  HENT:     Head: Normocephalic and atraumatic.     Mouth/Throat:     Mouth: Mucous membranes are moist.     Pharynx:  Oropharynx is clear.  Eyes:     Extraocular Movements: Extraocular movements intact.     Conjunctiva/sclera: Conjunctivae normal.     Pupils: Pupils are equal, round, and reactive to light.  Cardiovascular:     Rate and Rhythm: Normal rate and regular rhythm.     Pulses: Normal pulses.     Heart sounds: Normal heart sounds.  Pulmonary:     Effort: Pulmonary effort is normal.     Breath sounds: Normal breath sounds. No wheezing, rhonchi or rales.  Musculoskeletal:     Cervical back: Normal range of motion and neck supple.     Comments: Right hand: TTP over fourth metacarpal head, grip is 3/5, neurovascular intact, neurosensory intact, brisk cap refill  Skin:    General: Skin is warm and dry.  Neurological:     General: No focal deficit present.     Mental Status: He is alert and oriented to person, place, and time. Mental status is at baseline.      UC Treatments / Results  Labs (all labs ordered are listed, but only abnormal results are displayed) Labs Reviewed - No data to display  EKG   Radiology DG Hand Complete Right  Result Date: 02/27/2023 CLINICAL DATA:  Punching injury 5 days ago. EXAM: RIGHT HAND - COMPLETE 3+ VIEW COMPARISON:  None Available. FINDINGS: No evidence of acute  fracture or dislocation. Old healed boxer's fracture of the fifth metacarpal. IMPRESSION: No acute or traumatic finding. Old healed Boxer's fracture of the fifth metacarpal. Electronically Signed   By: Nelson Chimes M.D.   On: 02/27/2023 14:36    Procedures Procedures (including critical care time)  Medications Ordered in UC Medications - No data to display  Initial Impression / Assessment and Plan / UC Course  I have reviewed the triage vital signs and the nursing notes.  Pertinent labs & imaging results that were available during my care of the patient were reviewed by me and considered in my medical decision making (see chart for details).     MDM: 1.  Right hand pain-Rx'd Celebrex. Advised patient of right hand x-ray results with hardcopy provided to patient.  Instructed patient to take medication as directed with food to completion.  Advised patient may RICE affected area of right hand 30 minutes 3 times daily for the next 3 days.  Advised if symptoms worsen and/or unresolved please follow-up with PCP or here for further evaluation.  Patient discharged home, hemodynamically stable.  Final Clinical Impressions(s) / UC Diagnoses   Final diagnoses:  Right hand pain     Discharge Instructions      Advised patient of right hand x-ray results with hardcopy provided to patient.  Instructed patient to take medication as directed with food to completion.  Advised patient may RICE affected area of right hand 30 minutes 3 times daily for the next 3 days.  Advised if symptoms worsen and/or unresolved please follow-up with PCP or here for further evaluation.     ED Prescriptions     Medication Sig Dispense Auth. Provider   celecoxib (CELEBREX) 100 MG capsule Take 1 capsule (100 mg total) by mouth 2 (two) times daily for 15 days. 30 capsule Eliezer Lofts, FNP      PDMP not reviewed this encounter.   Eliezer Lofts, [place] 02/27/23 1505

## 2023-02-27 NOTE — ED Triage Notes (Signed)
Pt states punched a wooden door frame with rt hand 5 days ago. States doesn't have strength in rt hand when holding a hammer. Denies taking any meds.
